# Patient Record
Sex: Male | Born: 1980 | Hispanic: No | Marital: Single | Smoking: Current some day smoker
Health system: Southern US, Community
[De-identification: ages and names within clinical notes are randomized; demographics above are authoritative.]

---

## 2014-04-29 ENCOUNTER — Encounter (HOSPITAL_COMMUNITY): Payer: Self-pay | Admitting: Oncology

## 2014-04-29 ENCOUNTER — Emergency Department (HOSPITAL_COMMUNITY): Payer: Self-pay

## 2014-04-29 ENCOUNTER — Emergency Department (HOSPITAL_COMMUNITY)
Admission: EM | Admit: 2014-04-29 | Discharge: 2014-04-29 | Disposition: A | Discharge status: Home / Self Care | Payer: Self-pay | Attending: Emergency Medicine | Admitting: Emergency Medicine

## 2014-04-29 DIAGNOSIS — R4182 Altered mental status, unspecified: Secondary | ICD-10-CM

## 2014-04-29 DIAGNOSIS — F10920 Alcohol use, unspecified with intoxication, uncomplicated: Secondary | ICD-10-CM

## 2014-04-29 DIAGNOSIS — F10129 Alcohol abuse with intoxication, unspecified: Secondary | ICD-10-CM | POA: Insufficient documentation

## 2014-04-29 LAB — I-STAT CHEM 8, ED
BUN: 20 mg/dL (ref 6–23)
Calcium, Ion: 1.1 mmol/L — ABNORMAL LOW (ref 1.12–1.23)
Chloride: 106 mEq/L (ref 96–112)
Creatinine, Ser: 1.1 mg/dL (ref 0.50–1.35)
GLUCOSE: 110 mg/dL — AB (ref 70–99)
HCT: 51 % (ref 39.0–52.0)
HEMOGLOBIN: 17.3 g/dL — AB (ref 13.0–17.0)
Potassium: 3.7 mEq/L (ref 3.7–5.3)
Sodium: 145 mEq/L (ref 137–147)
TCO2: 21 mmol/L (ref 0–100)

## 2014-04-29 LAB — CBC WITH DIFFERENTIAL/PLATELET
Basophils Absolute: 0 10*3/uL (ref 0.0–0.1)
Basophils Relative: 0 % (ref 0–1)
EOS ABS: 0.1 10*3/uL (ref 0.0–0.7)
Eosinophils Relative: 1 % (ref 0–5)
HEMATOCRIT: 45.2 % (ref 39.0–52.0)
HEMOGLOBIN: 15.1 g/dL (ref 13.0–17.0)
Lymphocytes Relative: 31 % (ref 12–46)
Lymphs Abs: 2.6 10*3/uL (ref 0.7–4.0)
MCH: 30 pg (ref 26.0–34.0)
MCHC: 33.4 g/dL (ref 30.0–36.0)
MCV: 89.9 fL (ref 78.0–100.0)
MONOS PCT: 8 % (ref 3–12)
Monocytes Absolute: 0.6 10*3/uL (ref 0.1–1.0)
Neutro Abs: 5 10*3/uL (ref 1.7–7.7)
Neutrophils Relative %: 60 % (ref 43–77)
Platelets: 189 10*3/uL (ref 150–400)
RBC: 5.03 MIL/uL (ref 4.22–5.81)
RDW: 13.4 % (ref 11.5–15.5)
WBC: 8.4 10*3/uL (ref 4.0–10.5)

## 2014-04-29 LAB — ETHANOL: Alcohol, Ethyl (B): 338 mg/dL — ABNORMAL HIGH (ref 0–11)

## 2014-04-29 MED ORDER — ONDANSETRON HCL 4 MG/2ML IJ SOLN
4.0000 mg | Freq: Once | INTRAMUSCULAR | Status: AC
  Administered 2014-04-29: 4 mg via INTRAVENOUS
  Filled 2014-04-29: qty 2

## 2014-04-29 MED ORDER — SODIUM CHLORIDE 0.9 % IV BOLUS (SEPSIS)
1000.0000 mL | Freq: Once | INTRAVENOUS | Status: AC
  Administered 2014-04-29: 1000 mL via INTRAVENOUS

## 2014-04-29 NOTE — Discharge Instructions (Signed)
Alcohol Intoxication °Alcohol intoxication occurs when the amount of alcohol that a person has consumed impairs his or her ability to mentally and physically function. Alcohol directly impairs the normal chemical activity of the brain. Drinking large amounts of alcohol can lead to changes in mental function and behavior, and it can cause many physical effects that can be harmful.  °Alcohol intoxication can range in severity from mild to very severe. Various factors can affect the level of intoxication that occurs, such as the person's age, gender, weight, frequency of alcohol consumption, and the presence of other medical conditions (such as diabetes, seizures, or heart conditions). Dangerous levels of alcohol intoxication may occur when people drink large amounts of alcohol in a short period (binge drinking). Alcohol can also be especially dangerous when combined with certain prescription medicines or "recreational" drugs. °SIGNS AND SYMPTOMS °Some common signs and symptoms of mild alcohol intoxication include: °· Loss of coordination. °· Changes in mood and behavior. °· Impaired judgment. °· Slurred speech. °As alcohol intoxication progresses to more severe levels, other signs and symptoms will appear. These may include: °· Vomiting. °· Confusion and impaired memory. °· Slowed breathing. °· Seizures. °· Loss of consciousness. °DIAGNOSIS  °Your health care provider will take a medical history and perform a physical exam. You will be asked about the amount and type of alcohol you have consumed. Blood tests will be done to measure the concentration of alcohol in your blood. In many places, your blood alcohol level must be lower than 80 mg/dL (0.08%) to legally drive. However, many dangerous effects of alcohol can occur at much lower levels.  °TREATMENT  °People with alcohol intoxication often do not require treatment. Most of the effects of alcohol intoxication are temporary, and they go away as the alcohol naturally  leaves the body. Your health care provider will monitor your condition until you are stable enough to go home. Fluids are sometimes given through an IV access tube to help prevent dehydration.  °HOME CARE INSTRUCTIONS °· Do not drive after drinking alcohol. °· Stay hydrated. Drink enough water and fluids to keep your urine clear or pale yellow. Avoid caffeine.   °· Only take over-the-counter or prescription medicines as directed by your health care provider.   °SEEK MEDICAL CARE IF:  °· You have persistent vomiting.   °· You do not feel better after a few days. °· You have frequent alcohol intoxication. Your health care provider can help determine if you should see a substance use treatment counselor. °SEEK IMMEDIATE MEDICAL CARE IF:  °· You become shaky or tremble when you try to stop drinking.   °· You shake uncontrollably (seizure).   °· You throw up (vomit) blood. This may be bright red or may look like black coffee grounds.   °· You have blood in your stool. This may be bright red or may appear as a black, tarry, bad smelling stool.   °· You become lightheaded or faint.   °MAKE SURE YOU:  °· Understand these instructions. °· Will watch your condition. °· Will get help right away if you are not doing well or get worse. °Document Released: 03/12/2005 Document Revised: 02/02/2013 Document Reviewed: 11/05/2012 °ExitCare® Patient Information ©2015 [employer]. This information is not intended to replace advice given to you by your health care provider. Make sure you discuss any questions you have with your health care provider. ° ° °Emergency Department Resource Guide °1) Find a Doctor and Pay Out of Pocket °Although you won't have to find out who is covered by your   insurance plan, it is a good idea to ask around and get recommendations. You will then need to call the office and see if the doctor you have chosen will accept you as a new patient and what types of options they offer for patients who are self-pay.  Some doctors offer discounts or will set up payment plans for their patients who do not have insurance, but you will need to ask so you aren't surprised when you get to your appointment. ° °2) Contact Your Local Health Department °Not all health departments have doctors that can see patients for sick visits, but many do, so it is worth a call to see if yours does. If you don't know where your local health department is, you can check in your phone book. The CDC also has a tool to help you locate your state's health department, and many state websites also have listings of all of their local health departments. ° °3) Find a Walk-in Clinic °If your illness is not likely to be very severe or complicated, you may want to try a walk in clinic. These are popping up all over the country in pharmacies, drugstores, and shopping centers. They're usually staffed by nurse practitioners or physician assistants that have been trained to treat common illnesses and complaints. They're usually fairly quick and inexpensive. However, if you have serious medical issues or chronic medical problems, these are probably not your best option. ° °No Primary Care Doctor: °- Call [facility] at  832-8000 - they can help you locate a primary care doctor that  accepts your insurance, provides certain services, etc. °- Physician Referral Service- 1-800-533-3463 ° °Chronic Pain Problems: °Organization         Address  Phone   Notes  °[facility]  (336) 297-2271 Patients need to be referred by their primary care doctor.  ° °Medication Assistance: °Organization         Address  Phone   Notes  °[place] [facility] [address] °[place], [place] [address] (336) 641-8030 --Must be a resident of [place] °-- Must have NO insurance coverage whatsoever (no Medicaid/ Medicare, etc.) °-- The pt. MUST have a primary care doctor that directs their care regularly and follows them in the  community °  °[facility]  (866) 331-1348   °[employer]  (888) 892-1162   ° °Agencies that provide inexpensive medical care: °Organization         Address  Phone   Notes  °[facility]  (336) 832-8035   °[facility]    (336) 832-7272   °[facility] [address] °[place], [place] [address] (336) 832-4777   °[facility] [address], °[place] (336) 271-4999   °[facility]    (336) 373-0678   °[facility]    (336) 272-1050   °[facility] ° [address], [place] Phone:  (336) 832-4444, Fax:  (336) 832-4440 Hours of Operation:  9 am - 6 pm, M-F.  Also accepts Medicaid/Medicare and self-pay.  °[facility] ° [address], [place] Phone: (336) 832-3150, Fax: (336) 832-3151. Hours of Operation:  8:30 am - 5:30 pm, M-F.  Also accepts Medicaid and self-pay.  °[facility] [place] [address], [place] Phone: (336) 878-6027   °[facility] [address], [place],  (336)723-1848, Ext. 123 Mondays &   Thursdays: 7-9 AM.  First 15 patients are seen on a first come, first serve basis. °  ° °Medicaid-accepting [place] Providers: ° °Organization         Address  Phone   Notes  °[facility] [address], [place] (336) 641-2100 Also accepts self-pay patients.  °[facility] [address], [place] ° (336) 856-9996   °[facility] [address], [place] (336) 288-8857   °[facility] [address] [place] [address], [place] (336) 299-7000   °Veita Bland [address], [place]  ° (336) 373-1557 Only accepts [place] Access Medicaid patients after they have their name applied to their card.  ° °Self-Pay (no insurance) in [place]: ° °Organization         Address  Phone   Notes  °[facility] [address], [place] (336) 832-1970   °[facility] [address], [place] (336) 832-4400   °[facility] ° [address], [place] (336) 992-4800   °[facility]/Dr. Osei-Bonsu ° [address] [place] [address], [place] or [address], [place] (336) 841-8500 Phone number for both [place] and [place] locations is the same.  °[facility] [address], [place] (336) [phone]   °[facility] [address] [place] [address], [place] or [address] (336) 852-7530 °(336) 878-2260   °[facility] [address], [place] (336) 350-1642, phone; (336) 294-5005, fax Sees patients 1st and 3rd Saturday of every month.  Must not qualify for public or private insurance (i.e. Medicaid, Medicare, [place] Health Choice, Veterans' Benefits) • Household income should be no more than 200% of the poverty level •The clinic cannot treat you if you are pregnant or think you are pregnant • Sexually transmitted diseases are not treated at the clinic.  ° ° °Dental Care: °Organization         Address  Phone  Notes  °[facility] [address], [place] (336) 641-6152 Accepts children up to age 21 who are enrolled in Medicaid or [place] Health Choice; pregnant women with a Medicaid card; and children who have applied for Medicaid or [place] Health Choice, but were declined, whose parents can pay a reduced fee at time of service.  °[facility] [place]  [address], [place] (336) 641-7733 Accepts children up to age 21 who are enrolled in Medicaid or [place] Health Choice; pregnant women with a Medicaid card; and children who have applied for Medicaid or [place] Health Choice, but were declined, whose parents can pay a reduced fee at time of service.  °[facility] ° [address],  (336) 641-4533 Patients are seen by appointment only. Walk-ins are not accepted. [facility] will see patients 18 years of age and older. °Monday - Tuesday (8am-5pm) °Most Wednesdays (8:30-5pm) °$30 per visit, cash only  °[facility] ° [address], [place] (336) 641-4533 Patients are seen by appointment only. Walk-ins are not accepted. [facility] will see patients 18 years of age and older. °One Wednesday Evening (Monthly: Volunteer Based).  $30 per visit, cash only  °[facility]  (919) 537-3737 for adults; Children under   age 4, call Graduate Pediatric Dentistry at (919) [phone]. Children aged 4-14, please call (919) 537-3737 to request a pediatric application. ° Dental services are provided in all areas of dental care including fillings, crowns and bridges, complete and partial dentures, implants, gum treatment, root canals, and extractions. Preventive care is also provided. Treatment is provided to both adults and children. °Patients are selected via a lottery and there is often a waiting list. °  °[facility] [address], °[place] ° (336) 763-8833 [email or web address] °  °[facility] [address], [place], [place] (336)723-1848, Ext. 123 Second and Fourth Thursday of each month, opens at 6:30 AM; Clinic ends at 9 AM.  Patients are seen on a first-come first-served basis, and a limited number are seen during each clinic.  ° °[facility] ° [address], [place], [place] (336) 723-7904   Eligibility Requirements °You must have lived in [place], [place], or [place] counties for at least the last three months. °  You cannot be eligible for state or federal sponsored healthcare insurance, including Veterans Administration, Medicaid, or Medicare. °  You generally cannot be eligible for healthcare insurance through your employer.  °  How to apply: °Eligibility screenings are held every  Tuesday and Wednesday afternoon from 1:00 pm until 4:00 pm. You do not need an appointment for the interview!  °[facility] [address], [place], [place] 336-631-2330   °[facility]  336-342-8273   °[facility]  336-703-3100   °[facility]  336-570-6415   ° °Behavioral Health Resources in the Community: °Intensive Outpatient Programs °Organization         Address  Phone  Notes  °[facility] [address], [place], [place] 336-878-6098   °[facility] [address], [place], [place] 336-832-9800   °[facility] [address], [place], [place] ° 336-882-2125   °[facility] [address],  °[place], [place] 1-800-853-5163 or 336-641-4981   °Substance Abuse Resources °Organization         Address  Phone  Notes  °[facility]  336-882-2125   °[facility]  336-784-9470   °[facility]  336-285-9073   °[facility]  336-845-3988   °Residential & Outpatient Substance Abuse Program  1-800-659-3381   °Psychological Services °Organization         Address  Phone  Notes  °[facility]  336- 832-9600   °[facility]  336- 378-7881   °[facility] [address], [place] 1-800-853-5163 or 336-641-4981   ° °Mobile Crisis Teams °Organization         Address  Phone  Notes  °[facility], Mobile Crisis Care Unit  1-877-626-1772   °Assertive °[facility] ° [address] [place], [place] 336-834-9664   °Sharon DeEsch [address] °[place] 336-554-5454   ° °Self-Help/Support Groups °Organization         Address  Phone             Notes  °[facility] - variety of support groups  336- 373-1402 Call for more information  °Narcotics Anonymous (NA), [facility] [address], °[place]   2 meetings at this location   ° °Residential Treatment Programs °Organization         Address  Phone  Notes  °[facility] [address],    °[place]  1-866-801-8205   °[facility] ° [address], [place], [place] 704-293-8524   °[facility] [address], [place] 336-845-3988 Admissions: 8am-3pm M-F  °[facility] [address]    °[place], [place] 336-841-1104   °[facility] [address], [place], [place] 336-379-7146   °[facility] [address]  °[place], [place] 336-285-9073   °[facility] Intensive Outpatient [address], [place], [place] 336-852-3033   °[facility] [address]  °[place], [place] 1-877-615-2722 or 336-784-9470   °[facility] [address] [place], [place] 336-227-7417 Accepts Medicaid  °[facility] [address]  °[place] 1-800-659-3381 Substance Abuse/Addiction Treatment  ° °[place] Behavioral Health Resources °Organization         Address  Phone  Notes  °[facility]  (888) 581-9988   °Julie Brannon, PhD [address] [place], [place]   (336) 349-5553 or (336) 951-0000   °[facility]   [address] °[place], [place] (336) 349-4454   °[facility] [address], [place], [place] (336) 342-8316 Insurance/Medicaid/sponsorship through [facility]  °[facility] [address]                                    [place], [place] (336) 342-8316 Therapy/tele-psych/case  °[facility] [address]  ° [place], [place] (336) 349-2233    °Dr. Arfeen  (336) 349-4544   °[facility] [place]  [employer] [place] [facility] 1) [address], [place] °2) [address], [place] °3)  [address], [place] (336) 349-3220 °(336) 342-7768 ° °(336) 342-8140   °[place] [facility] (336) 342-1394 or (336) 342-3537 (After  Hours)    ° ° ° °

## 2014-04-29 NOTE — ED Notes (Signed)
Pt is too intoxicated to answer medical hx or any other questions at this time.  Pt is on 5 lead with VS cycling Q .  Suction and oxygen are set up at the bedside as pt was covered in vomit upon arrival and clothes had to be cut off.  Pt does respond to painful stimuli.  VS are WNL.

## 2014-04-29 NOTE — ED Provider Notes (Signed)
Patient signed out to me by Sharen HonesGail Schultz, NP-C with plan to follow-up with patient wants alcohol intoxication wears off, and once patient is alert and oriented, and sober he is to be discharged home.  Labs unremarkable. CT head unremarkable for any acute pathology. Patient up ad lib in his room, and eating without difficulty. Patient requesting to go home. Patient well appearing, sitting upright in his bed, in no acute distress.Patient fully alert answer questions appropriately in full, clear sentences. Patient with normal gait and normal coordination. We will discharge patient at this time. I discussed return precautions with patient, encouraged him to follow-up with his primary care provider. I provided patient with resource guide to help him find a primary care provider. I encouraged patient to call or return to ER should he have any questions or concerns.  BP 118/74 mmHg  Pulse 95  Resp 16  SpO2 97%  Signed,  Ladona MowJoe Alyx Gee, PA-C 8:04 AM   Monte FantasiaJoseph W Aricela Bertagnolli, PA-C 04/29/14 1109  Shon Batonourtney F Horton, MD 05/01/14 [phone]
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
## 2014-05-01 ENCOUNTER — Ambulatory Visit (INDEPENDENT_AMBULATORY_CARE_PROVIDER_SITE_OTHER): Payer: Self-pay | Admitting: Emergency Medicine

## 2014-05-01 ENCOUNTER — Encounter: Payer: Self-pay | Admitting: Emergency Medicine

## 2014-05-01 VITALS — BP 118/78 | HR 68 | Temp 98.3°F | Resp 16 | Ht 69.0 in | Wt 169.0 lb

## 2014-05-01 DIAGNOSIS — F1010 Alcohol abuse, uncomplicated: Secondary | ICD-10-CM
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
## 2014-05-01 NOTE — Patient Instructions (Signed)
Intoxicación alcohólica °(Alcohol Intoxication) °La intoxicación alcohólica se produce cuando la cantidad de alcohol que se ha consumido daña la capacidad de funcionamiento mental y físico. El alcohol deteriora directamente la actividad química normal del cerebro. Beber grandes cantidades de alcohol puede conducir a cambios en el funcionamiento mental y en el comportamiento, y puede causar muchos efectos físicos que pueden ser perjudiciales.  °La intoxicación alcohólica puede variar en gravedad desde leve hasta muy grave. Hay varios factores que pueden afectar el nivel de intoxicación que se produce, como la edad de la persona, el sexo, el peso, la frecuencia de consumo de alcohol, y la presencia de otras enfermedades médicas (como diabetes, convulsiones o enfermedades del corazón). Los niveles peligrosos de intoxicación por alcohol pueden ocurrir cuando las personas beben grandes cantidades de alcohol en un corto periodo de tiempo (emborracharse). El alcohol también puede ser especialmente peligroso cuando se combina con ciertos medicamentos recetados o drogas "recreativas". °SIGNOS Y SÍNTOMAS °Algunos de los signos y síntomas comunes de intoxicación leve por alcohol incluyen: °· Pérdida de la coordinación. °· Cambios en el estado de ánimo y la conducta. °· Incapacidad para razonar. °· Hablar arrastrando las palabras. °A medida que la intoxicación por alcohol avanza a niveles más graves, van a aparecer otros signos y síntomas. Estos pueden ser: °· Vómitos. °· Confusión y alteración de la memoria. °· Disminución de la frecuencia respiratoria. °· Convulsiones. °· Pérdida de la conciencia. °DIAGNÓSTICO  °El médico le hará una historia clínica y un examen físico. Se le preguntará acerca de la cantidad y el tipo de alcohol que ha consumido. Se le realizarán análisis de sangre para medir la concentración de alcohol en sangre. En muchos lugares, el nivel de alcohol en la sangre debe ser inferior a 80 mg / dL (0,08%) para  poder conducir legalmente. Sin embargo, hay muchos efectos peligrosos del alcohol que pueden ocurrir con niveles mucho más bajos.  °TRATAMIENTO  °Las personas con intoxicación por alcohol a menudo no requieren tratamiento. La mayor parte de los efectos de la intoxicación por alcohol son temporales, y desaparecen a medida que el alcohol abandona el cuerpo de forma natural. El profesional controlará su estado hasta que esté lo suficientemente estable como para volver a casa. A veces se administran líquidos por vía intravenosa para ayudar a evitar la deshidratación.  °INSTRUCCIONES PARA EL CUIDADO EN EL HOGAR °· No conduzca vehículos después de beber alcohol. °· Manténgase hidratado. Beba gran cantidad de líquido para mantener la orina de tono claro o color amarillo pálido. Evite la cafeína.   °· Tome sólo medicamentos de venta libre o recetados, según las indicaciones del médico.   °SOLICITE ATENCIÓN MÉDICA SI:  °· Tiene vómitos persistentes.   °· No mejora luego de algunos días. °· Se intoxica con alcohol con frecuencia. El médico podrá ayudarlo a decidir si debe consultar a un terapeuta especializado en el abuso de sustancias. °SOLICITE ATENCIÓN MÉDICA DE INMEDIATO SI:  °· Se siente vacilante o tembloroso cuando trata de abandonar el hábito.   °· Comienza a temblar de manera incontrolable (convulsiones).   °· Vomita sangre. Puede ser sangre de color rojo brillante o similar al sedimento del café negro.   °· Observa sangre en la materia fecal. Puede ser de color rojo brillante o de aspecto alquitranado, con olor fétido.   °· Se siente mareado o se desmaya.   °ASEGÚRESE DE QUE:  °· Comprende estas instrucciones. °· Controlará su afección. °· Recibirá ayuda de inmediato si no mejora o si empeora. °Document Released: 06/02/2005 Document Revised: 02/02/2013 °[employer]® Patient Information ©  2015 [employer]. This information is not intended to replace advice given to you by your health care provider. Make sure you  discuss any questions you have with your health care provider.

## 2014-05-01 NOTE — Progress Notes (Signed)
This encounter was created in error - please disregard.

## 2015-04-26 IMAGING — CT CT HEAD W/O CM
2 series · 16 of 30 positions shown, 20 images · non-contrast
Comparison: None.

CLINICAL DATA: Intoxication, emesis.

EXAM:
CT HEAD WITHOUT CONTRAST
TECHNIQUE: Contiguous axial images were obtained from the base of the skull
through the vertex without intravenous contrast.

[Series 2: head w/o · axial · non-contrast · 0.45mm/px · z∈[-136,-11]mm · 13 of 31 slices shown, 17 images]
[im 3/31  brain]
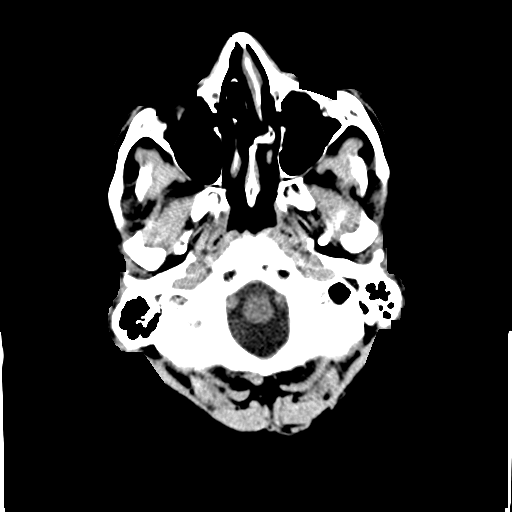
[im 3/31  bone]
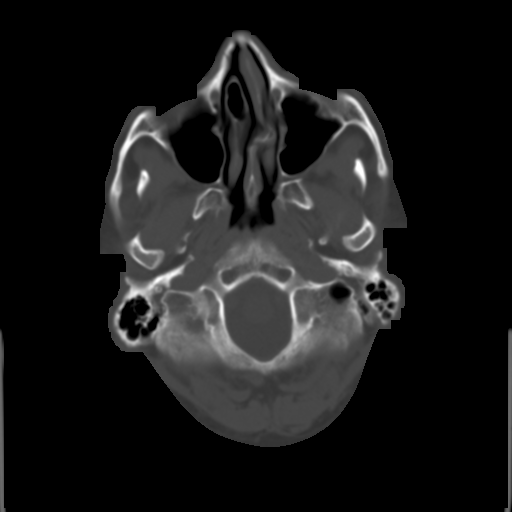
[im 5/31  brain]
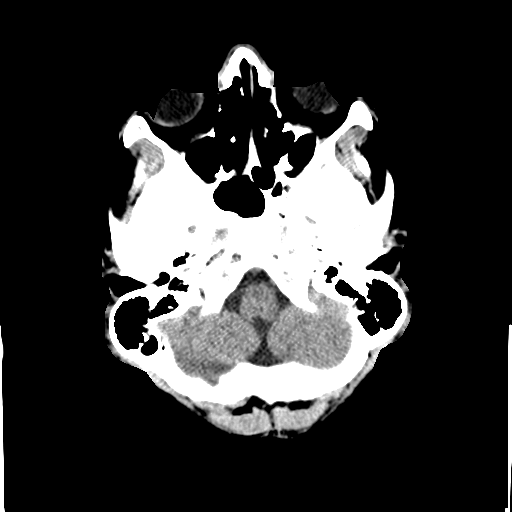
[im 7/31  brain]
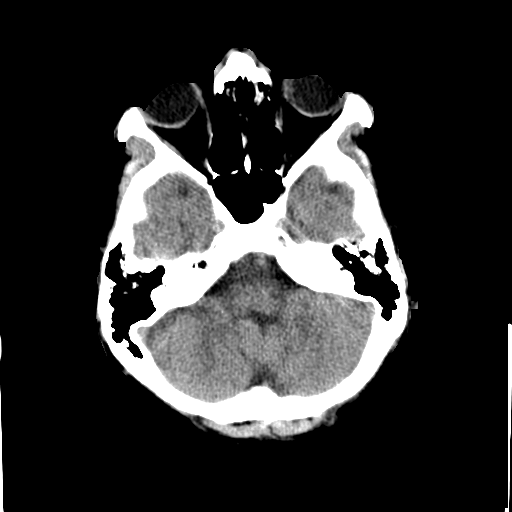
[im 9/31  brain]
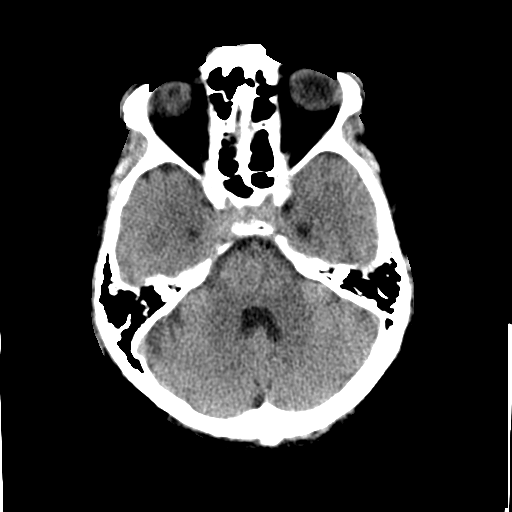
[im 11/31  brain]
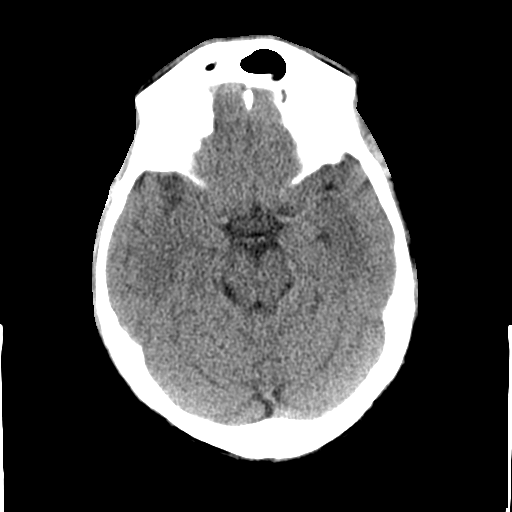
[im 11/31  bone]
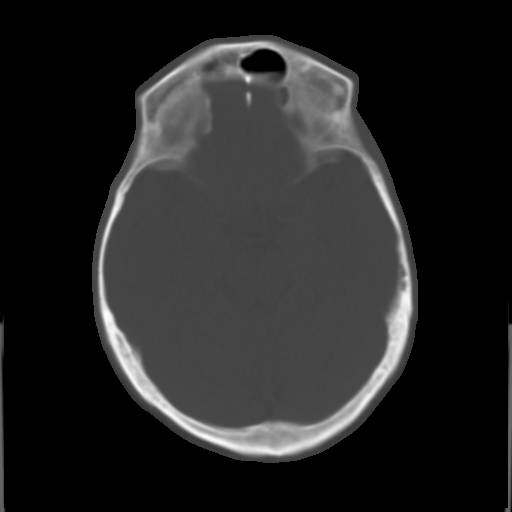
[im 13/31  brain]
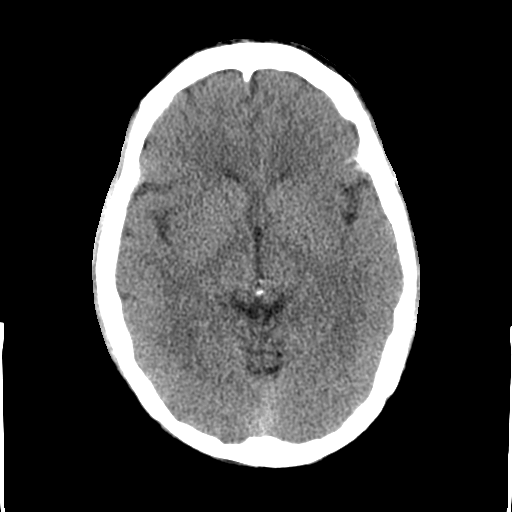
[im 16/31  brain]
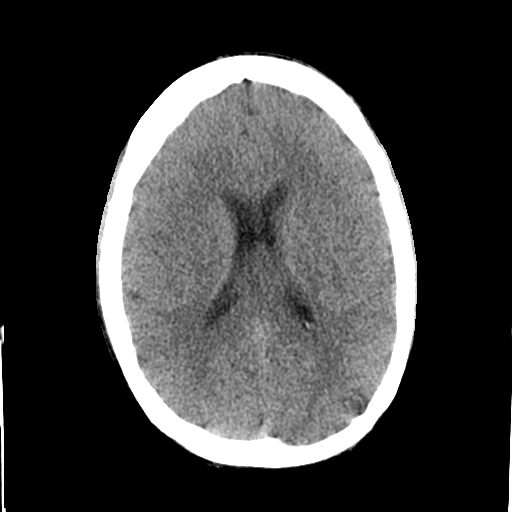
[im 18/31  brain]
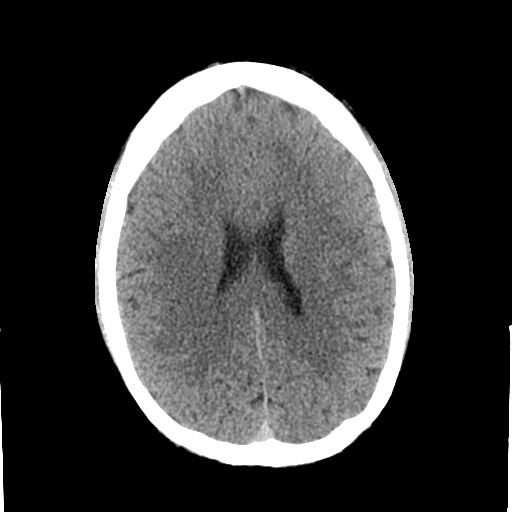
[im 20/31  brain]
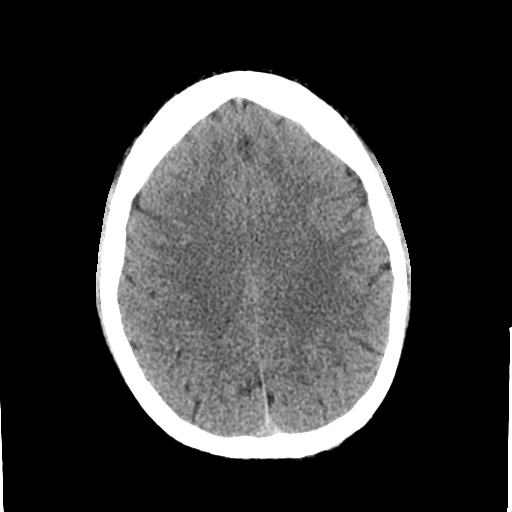
[im 20/31  bone]
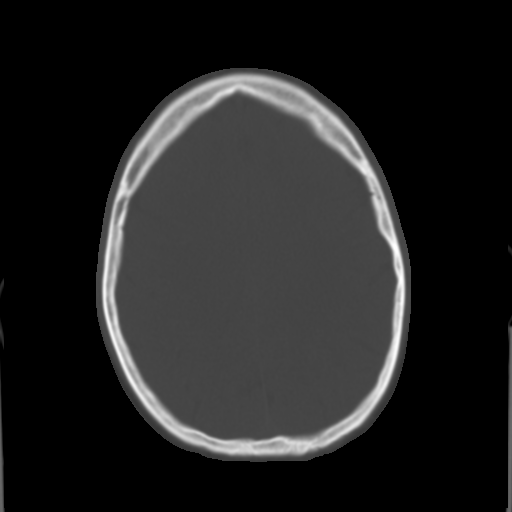
[im 22/31  brain]
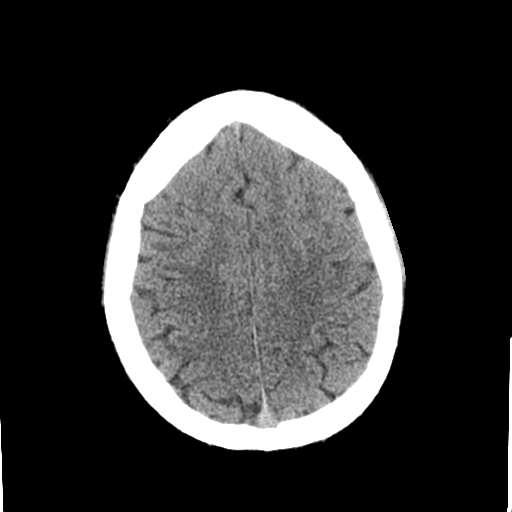
[im 24/31  brain]
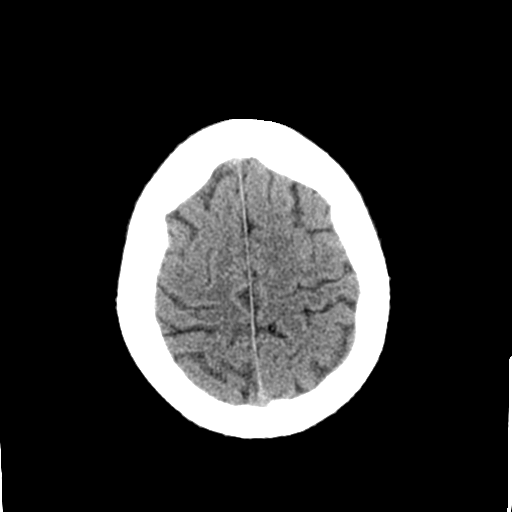
[im 26/31  brain]
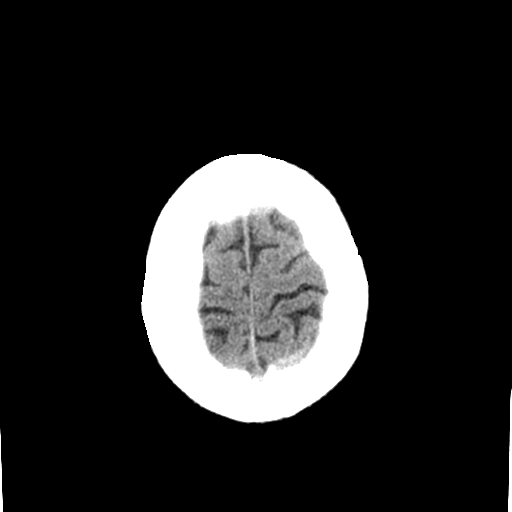
[im 28/31  brain]
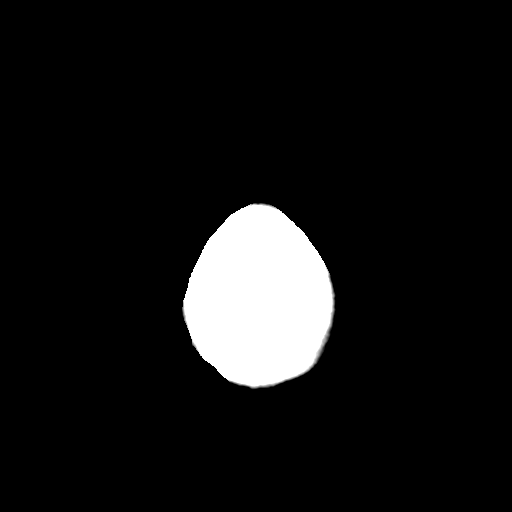
[im 28/31  bone]
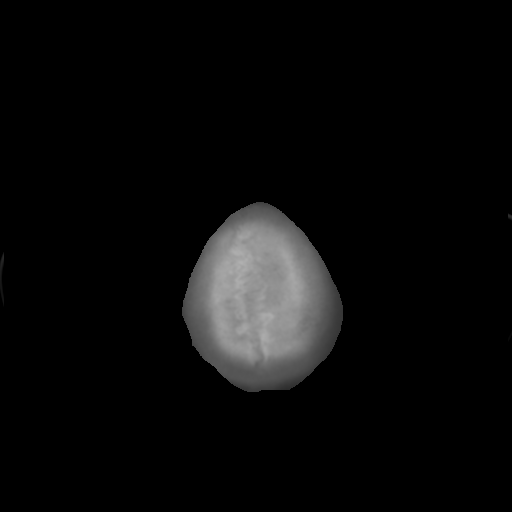

[Series 3: bone windows · axial · 0.45mm/px · z∈[-136,-91]mm · 3 of 33 slices shown]
[im 3/33  bone]
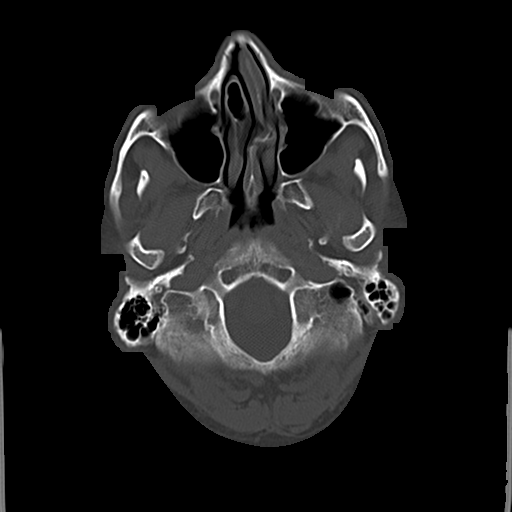
[im 7/33  bone]
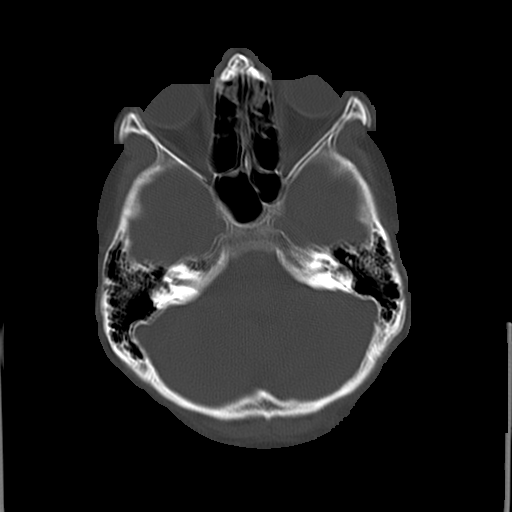
[im 12/33  bone]
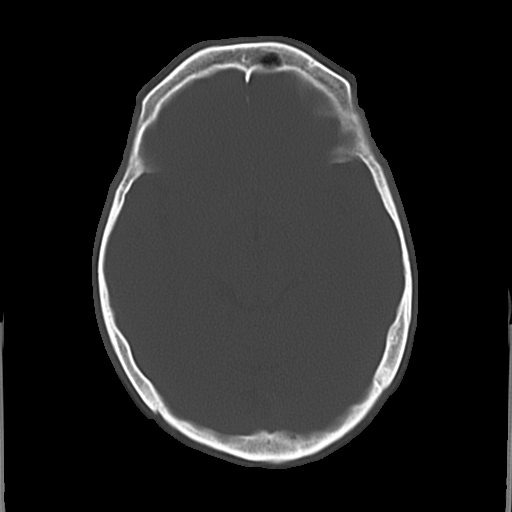

[16 of 30 positions shown; findings below may reference images not displayed]

FINDINGS: Mild motion degraded examination.

The ventricles and sulci are normal. No intraparenchymal hemorrhage,
mass effect nor midline shift. No acute large vascular territory
infarcts.

No abnormal extra-axial fluid collections. Basal cisterns are
patent.

No skull fracture. The included ocular globes and orbital contents
are non-suspicious. Mild suspected ethmoid mucosal thickening no
motion degrades sensitivity. No paranasal sinus air-fluid levels.
IMPRESSION: Mild motion degraded examination without acute intracranial process.

  By: Reind Tolen
